# Patient Record
Sex: Male | Born: 1980 | Race: Black or African American | Hispanic: No | Marital: Single | Smoking: Never smoker
Health system: Southern US, Community
[De-identification: ages and names within clinical notes are randomized; demographics above are authoritative.]

---
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
## 2015-02-14 ENCOUNTER — Emergency Department (HOSPITAL_BASED_OUTPATIENT_CLINIC_OR_DEPARTMENT_OTHER): Payer: Self-pay

## 2015-02-14 ENCOUNTER — Encounter (HOSPITAL_BASED_OUTPATIENT_CLINIC_OR_DEPARTMENT_OTHER): Payer: Self-pay

## 2015-02-14 ENCOUNTER — Emergency Department (HOSPITAL_BASED_OUTPATIENT_CLINIC_OR_DEPARTMENT_OTHER)
Admission: EM | Admit: 2015-02-14 | Discharge: 2015-02-14 | Disposition: A | Discharge status: Home / Self Care | Payer: Self-pay | Attending: Emergency Medicine | Admitting: Emergency Medicine

## 2015-02-14 DIAGNOSIS — Y9389 Activity, other specified: Secondary | ICD-10-CM | POA: Insufficient documentation

## 2015-02-14 DIAGNOSIS — Y9289 Other specified places as the place of occurrence of the external cause: Secondary | ICD-10-CM | POA: Insufficient documentation

## 2015-02-14 DIAGNOSIS — W228XXA Striking against or struck by other objects, initial encounter: Secondary | ICD-10-CM | POA: Insufficient documentation

## 2015-02-14 DIAGNOSIS — Y998 Other external cause status: Secondary | ICD-10-CM | POA: Insufficient documentation

## 2015-02-14 DIAGNOSIS — S60221A Contusion of right hand, initial encounter: Secondary | ICD-10-CM

## 2015-02-14 NOTE — Discharge Instructions (Signed)
Keep the Ace wrap on to reduce swelling.  Keep your hand elevated above the level of your heart.  Apply ice for 20 minutes at a time 4-5 times per day to decrease pain and swelling.  Tylenol as needed for pain.      Contusion A contusion is a deep bruise. Contusions happen when an injury causes bleeding under the skin. Signs of bruising include pain, puffiness (swelling), and discolored skin. The contusion may turn blue, purple, or yellow. HOME CARE   Put ice on the injured area.  Put ice in a plastic bag.  Place a towel between your skin and the bag.  Leave the ice on for 15-20 minutes, 03-04 times a day.  Only take medicine as told by your doctor.  Rest the injured area.  If possible, raise (elevate) the injured area to lessen puffiness. GET HELP RIGHT AWAY IF:   You have more bruising or puffiness.  You have pain that is getting worse.  Your puffiness or pain is not helped by medicine. MAKE SURE YOU:   Understand these instructions.  Will watch your condition.  Will get help right away if you are not doing well or get worse. Document Released: 11/22/2007 Document Revised: 08/28/2011 Document Reviewed: 04/10/2011 [facility] Patient Information 2015 [place], [place]. This information is not intended to replace advice given to you by your health care provider. Make sure you discuss any questions you have with your health care provider.
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
## 2016-02-06 IMAGING — DX DG HAND COMPLETE 3+V*R*
3 series · 3 of 3 positions shown · non-contrast
Comparison: None

CLINICAL DATA: RIGHT hand injury 9 days ago, posterior pain and
swelling, unable straighten fingers

EXAM:
RIGHT HAND - COMPLETE 3+ VIEW

[hand pa]
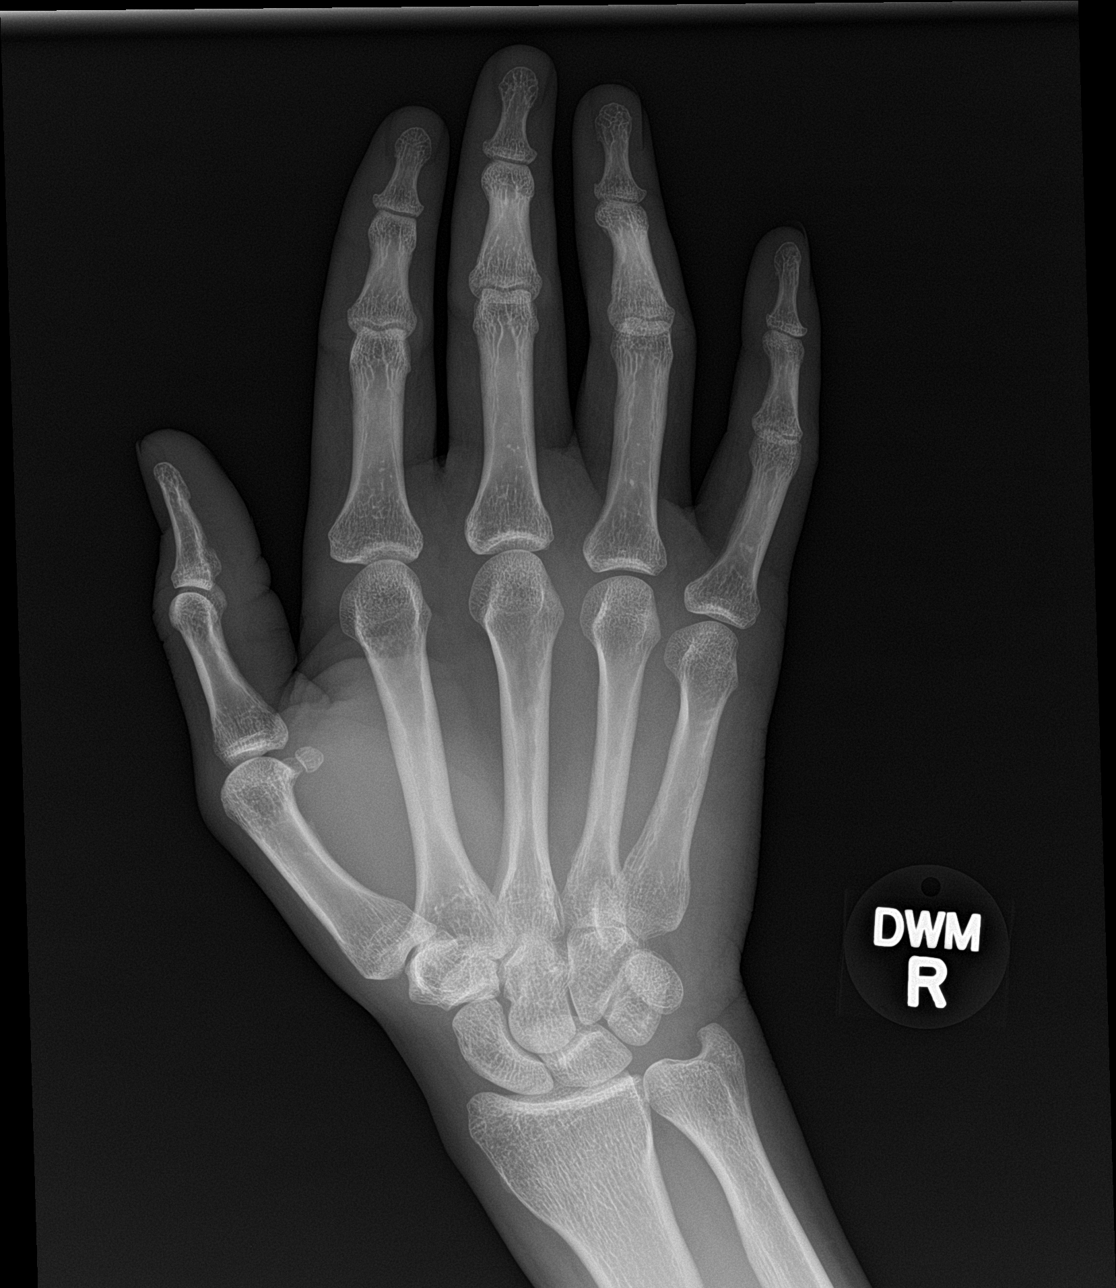

[hand obl]
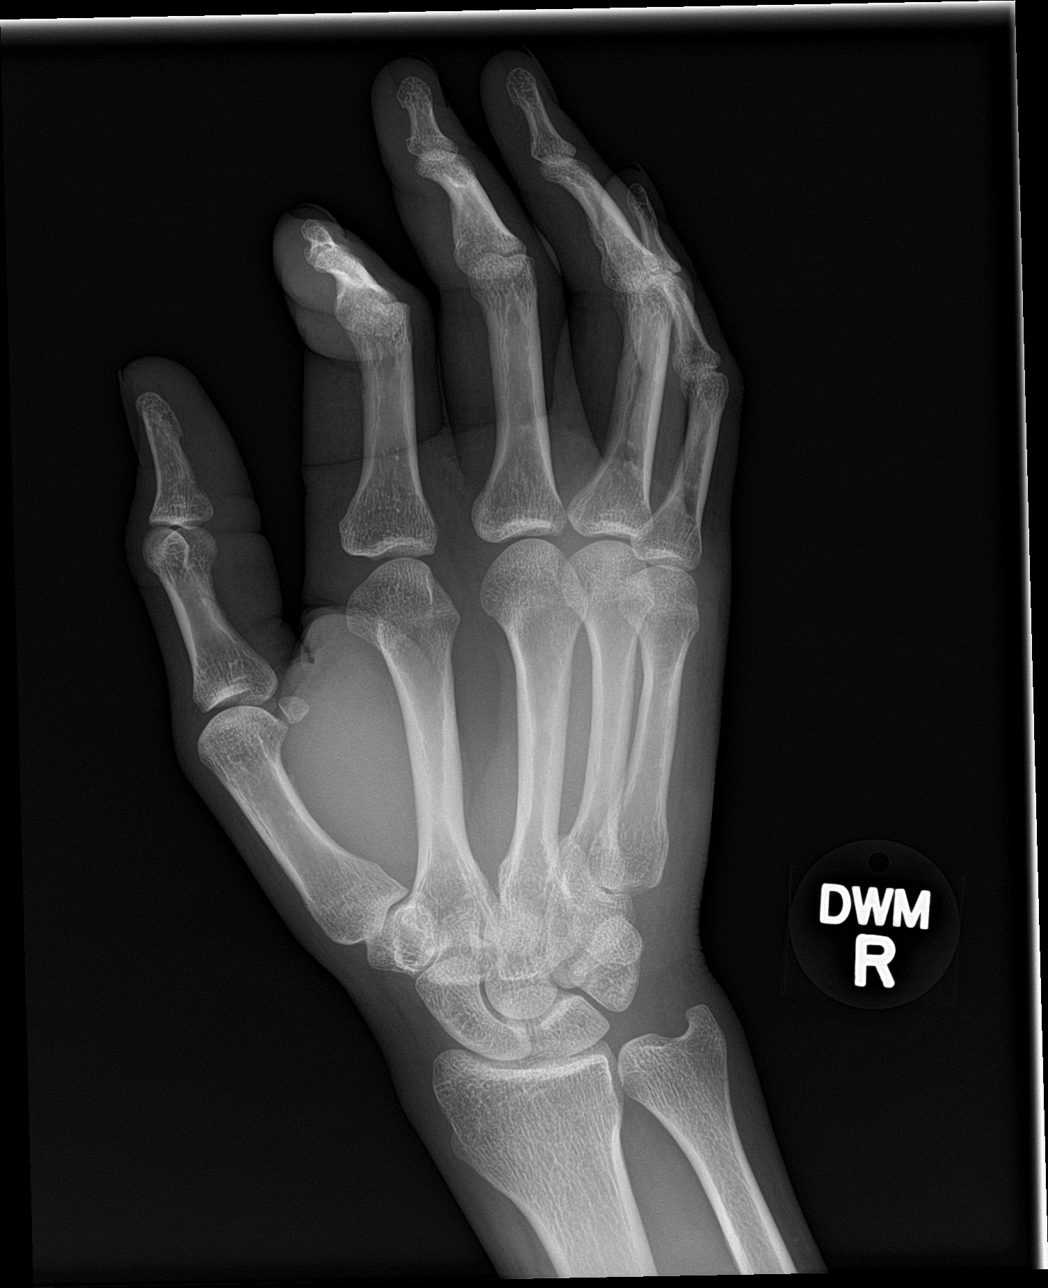

[hand lat]
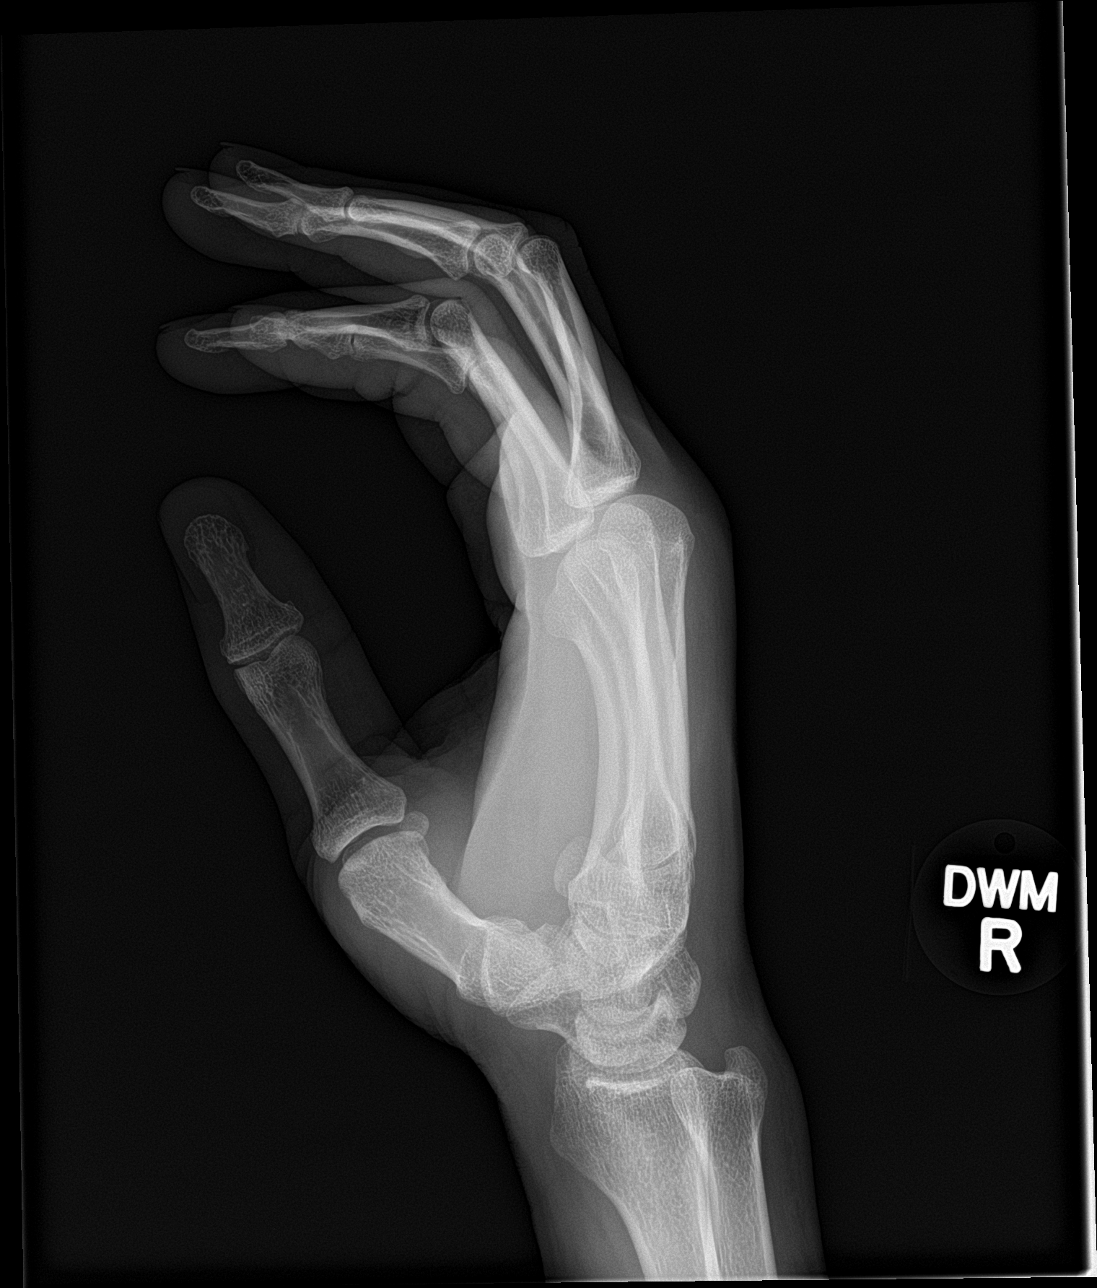

[3 of 3 positions shown; findings below may reference images not displayed]

FINDINGS: Osseous mineralization normal.

Joint spaces preserved.

Fingers partially superimposed on lateral view limiting assessment.

Dorsal soft tissue swelling.

No acute fracture, dislocation or bone destruction.
IMPRESSION: No acute osseous abnormalities identified as above.
